# Patient Record
Sex: Female | Born: 2000 | Race: Black or African American | Hispanic: No | Marital: Single | Smoking: Never smoker
Health system: Southern US, Community
[De-identification: ages and names within clinical notes are randomized; demographics above are authoritative.]

## PROBLEM LIST (undated history)

## (undated) DIAGNOSIS — T7840XA Allergy, unspecified, initial encounter: Secondary | ICD-10-CM

## (undated) DIAGNOSIS — R011 Cardiac murmur, unspecified: Secondary | ICD-10-CM

## (undated) HISTORY — DX: Cardiac murmur, unspecified: R01.1

## (undated) HISTORY — DX: Allergy, unspecified, initial encounter: T78.40XA

---
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
## 2019-07-23 DIAGNOSIS — Z03818 Encounter for observation for suspected exposure to other biological agents ruled out: Secondary | ICD-10-CM | POA: Diagnosis not present

## 2019-08-13 DIAGNOSIS — Z03818 Encounter for observation for suspected exposure to other biological agents ruled out: Secondary | ICD-10-CM | POA: Diagnosis not present

## 2019-10-04 DIAGNOSIS — L309 Dermatitis, unspecified: Secondary | ICD-10-CM | POA: Diagnosis not present

## 2019-11-26 DIAGNOSIS — R519 Headache, unspecified: Secondary | ICD-10-CM | POA: Diagnosis not present

## 2019-11-26 DIAGNOSIS — R0981 Nasal congestion: Secondary | ICD-10-CM | POA: Diagnosis not present

## 2019-11-26 DIAGNOSIS — R197 Diarrhea, unspecified: Secondary | ICD-10-CM | POA: Diagnosis not present

## 2019-11-26 DIAGNOSIS — Z20828 Contact with and (suspected) exposure to other viral communicable diseases: Secondary | ICD-10-CM | POA: Diagnosis not present

## 2019-12-10 DIAGNOSIS — Z03818 Encounter for observation for suspected exposure to other biological agents ruled out: Secondary | ICD-10-CM | POA: Diagnosis not present

## 2019-12-18 DIAGNOSIS — Z03818 Encounter for observation for suspected exposure to other biological agents ruled out: Secondary | ICD-10-CM | POA: Diagnosis not present

## 2020-01-07 DIAGNOSIS — Z03818 Encounter for observation for suspected exposure to other biological agents ruled out: Secondary | ICD-10-CM | POA: Diagnosis not present

## 2020-01-28 DIAGNOSIS — Z03818 Encounter for observation for suspected exposure to other biological agents ruled out: Secondary | ICD-10-CM | POA: Diagnosis not present

## 2020-02-19 DIAGNOSIS — Z20828 Contact with and (suspected) exposure to other viral communicable diseases: Secondary | ICD-10-CM | POA: Diagnosis not present

## 2020-03-25 DIAGNOSIS — Z03818 Encounter for observation for suspected exposure to other biological agents ruled out: Secondary | ICD-10-CM | POA: Diagnosis not present

## 2020-04-07 DIAGNOSIS — J029 Acute pharyngitis, unspecified: Secondary | ICD-10-CM | POA: Diagnosis not present

## 2020-04-07 DIAGNOSIS — R519 Headache, unspecified: Secondary | ICD-10-CM | POA: Diagnosis not present

## 2020-04-07 DIAGNOSIS — Z03818 Encounter for observation for suspected exposure to other biological agents ruled out: Secondary | ICD-10-CM | POA: Diagnosis not present

## 2020-04-16 DIAGNOSIS — Z20822 Contact with and (suspected) exposure to covid-19: Secondary | ICD-10-CM | POA: Diagnosis not present

## 2020-05-04 DIAGNOSIS — Z20822 Contact with and (suspected) exposure to covid-19: Secondary | ICD-10-CM | POA: Diagnosis not present

## 2020-05-04 DIAGNOSIS — Z03818 Encounter for observation for suspected exposure to other biological agents ruled out: Secondary | ICD-10-CM | POA: Diagnosis not present

## 2020-05-21 DIAGNOSIS — Z23 Encounter for immunization: Secondary | ICD-10-CM | POA: Diagnosis not present

## 2020-08-04 DIAGNOSIS — Z03818 Encounter for observation for suspected exposure to other biological agents ruled out: Secondary | ICD-10-CM | POA: Diagnosis not present

## 2020-11-12 DIAGNOSIS — L309 Dermatitis, unspecified: Secondary | ICD-10-CM | POA: Diagnosis not present

## 2022-06-27 DIAGNOSIS — Z13220 Encounter for screening for lipoid disorders: Secondary | ICD-10-CM | POA: Diagnosis not present

## 2022-06-27 DIAGNOSIS — Z0000 Encounter for general adult medical examination without abnormal findings: Secondary | ICD-10-CM | POA: Diagnosis not present

## 2022-08-01 DIAGNOSIS — D473 Essential (hemorrhagic) thrombocythemia: Secondary | ICD-10-CM | POA: Diagnosis not present

## 2022-09-06 ENCOUNTER — Other Ambulatory Visit: Payer: Self-pay | Admitting: Nurse Practitioner

## 2022-09-06 ENCOUNTER — Ambulatory Visit
Admission: RE | Admit: 2022-09-06 | Discharge: 2022-09-06 | Disposition: A | Discharge status: Home / Self Care | Payer: No Typology Code available for payment source | Source: Ambulatory Visit | Attending: Nurse Practitioner | Admitting: Nurse Practitioner

## 2022-09-06 DIAGNOSIS — Z021 Encounter for pre-employment examination: Secondary | ICD-10-CM
